# Patient Record
Sex: Female | Born: 1985 | Race: White | Hispanic: No | Marital: Single | Smoking: Current every day smoker
Health system: Southern US, Community
[De-identification: ages and names within clinical notes are randomized; demographics above are authoritative.]

## PROBLEM LIST (undated history)

## (undated) HISTORY — PX: CHOLECYSTECTOMY: SHX55

---

## 2007-01-20 ENCOUNTER — Ambulatory Visit: Payer: Self-pay | Admitting: Cardiology

## 2013-04-22 ENCOUNTER — Emergency Department (HOSPITAL_COMMUNITY): Payer: Medicare Other

## 2013-04-22 ENCOUNTER — Emergency Department (HOSPITAL_COMMUNITY)
Admission: EM | Admit: 2013-04-22 | Discharge: 2013-04-22 | Discharge status: Against Medical Advice | Payer: Medicare Other | Attending: Emergency Medicine | Admitting: Emergency Medicine

## 2013-04-22 ENCOUNTER — Encounter (HOSPITAL_COMMUNITY): Payer: Self-pay | Admitting: Emergency Medicine

## 2013-04-22 DIAGNOSIS — M25569 Pain in unspecified knee: Secondary | ICD-10-CM | POA: Insufficient documentation

## 2013-04-22 DIAGNOSIS — Z87828 Personal history of other (healed) physical injury and trauma: Secondary | ICD-10-CM | POA: Insufficient documentation

## 2013-04-22 DIAGNOSIS — F17200 Nicotine dependence, unspecified, uncomplicated: Secondary | ICD-10-CM | POA: Insufficient documentation

## 2013-04-22 NOTE — ED Notes (Signed)
C/O left knee pain that started 2 days ago. Pt states she sprained meniscus 5119yrs ago on same knee. Pt states she has tried elevating it, ice, elevation, and ibuprofen and Tylenol without relief.

## 2013-04-22 NOTE — ED Notes (Signed)
Pt c/o knee pain x 2 days, denies injury.

## 2015-06-14 IMAGING — CR DG KNEE COMPLETE 4+V*L*
4 series · 4 of 4 positions shown · non-contrast
Comparison: None.

CLINICAL DATA: Pain of 2-3 days duration

EXAM:
LEFT KNEE - COMPLETE 4+ VIEW

[view not recorded (1 of 4)]
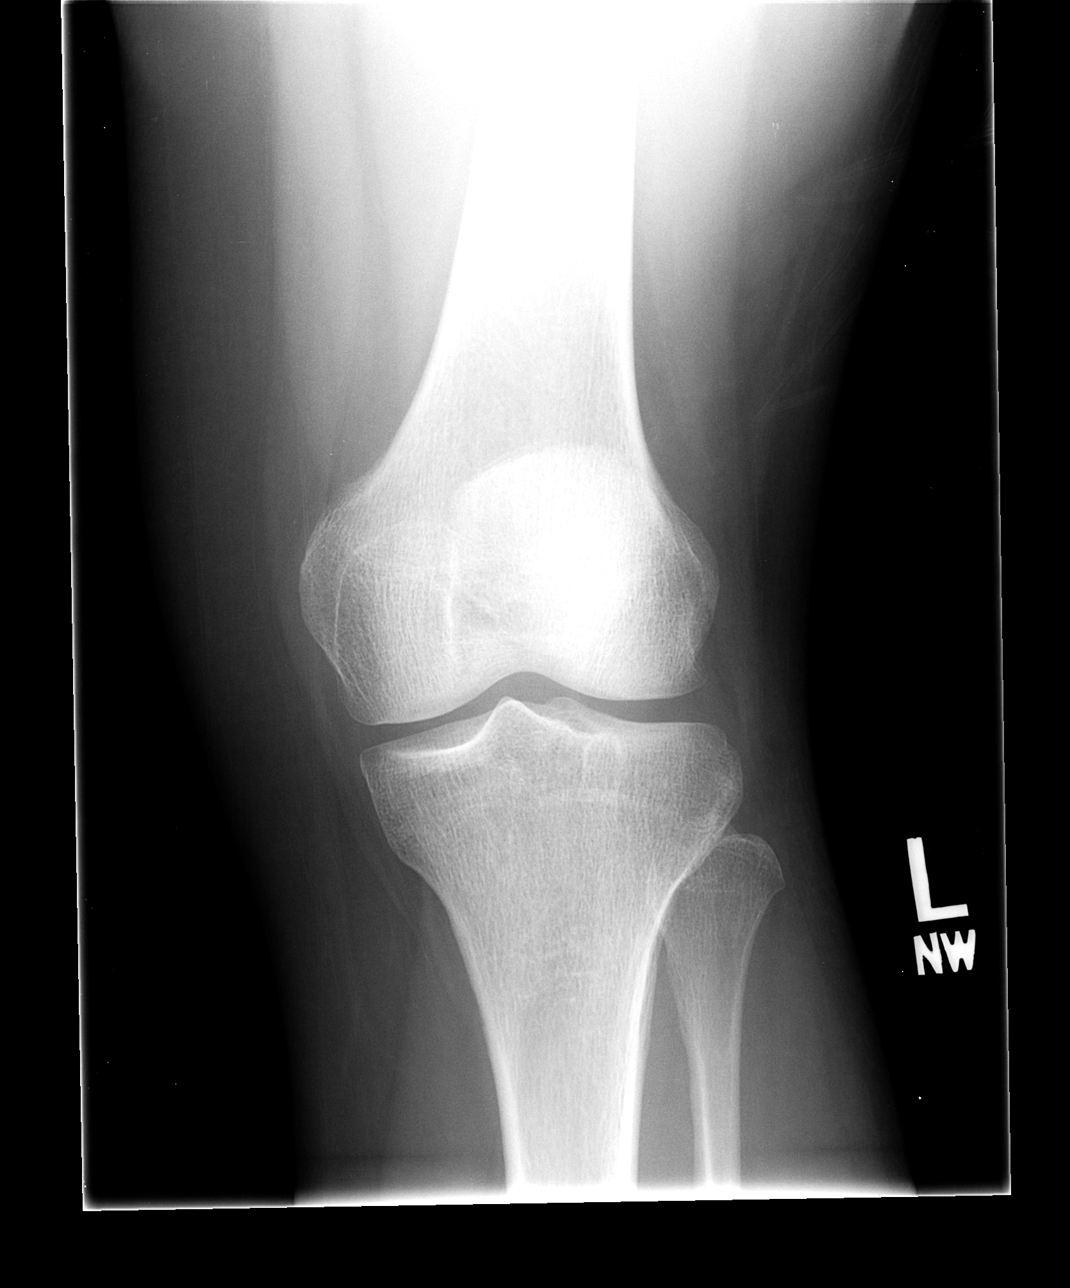

[view not recorded (2 of 4)]
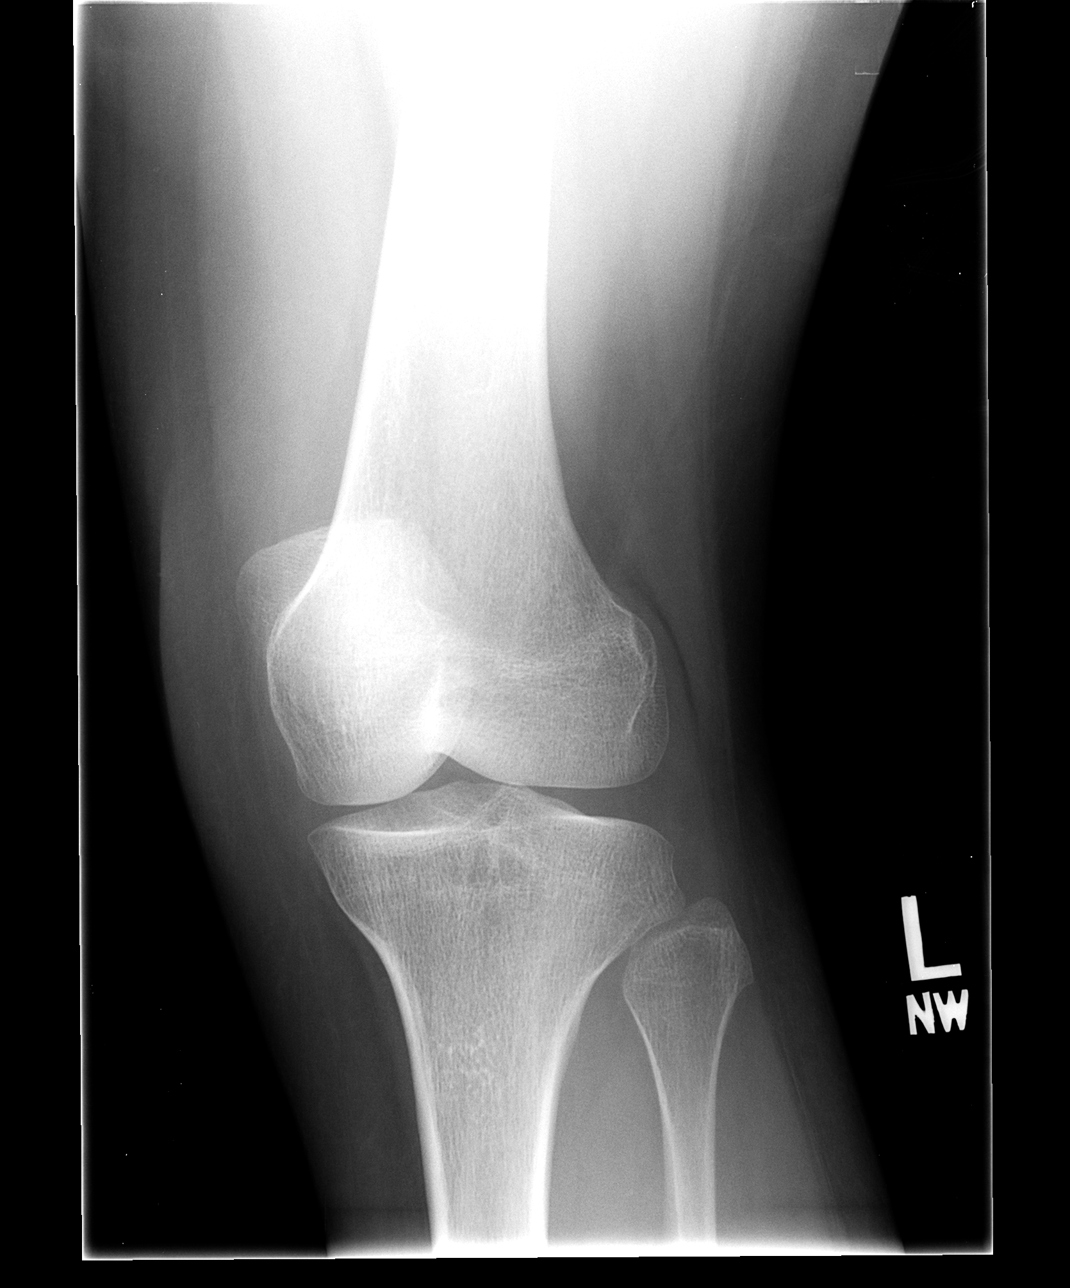

[view not recorded (3 of 4)]
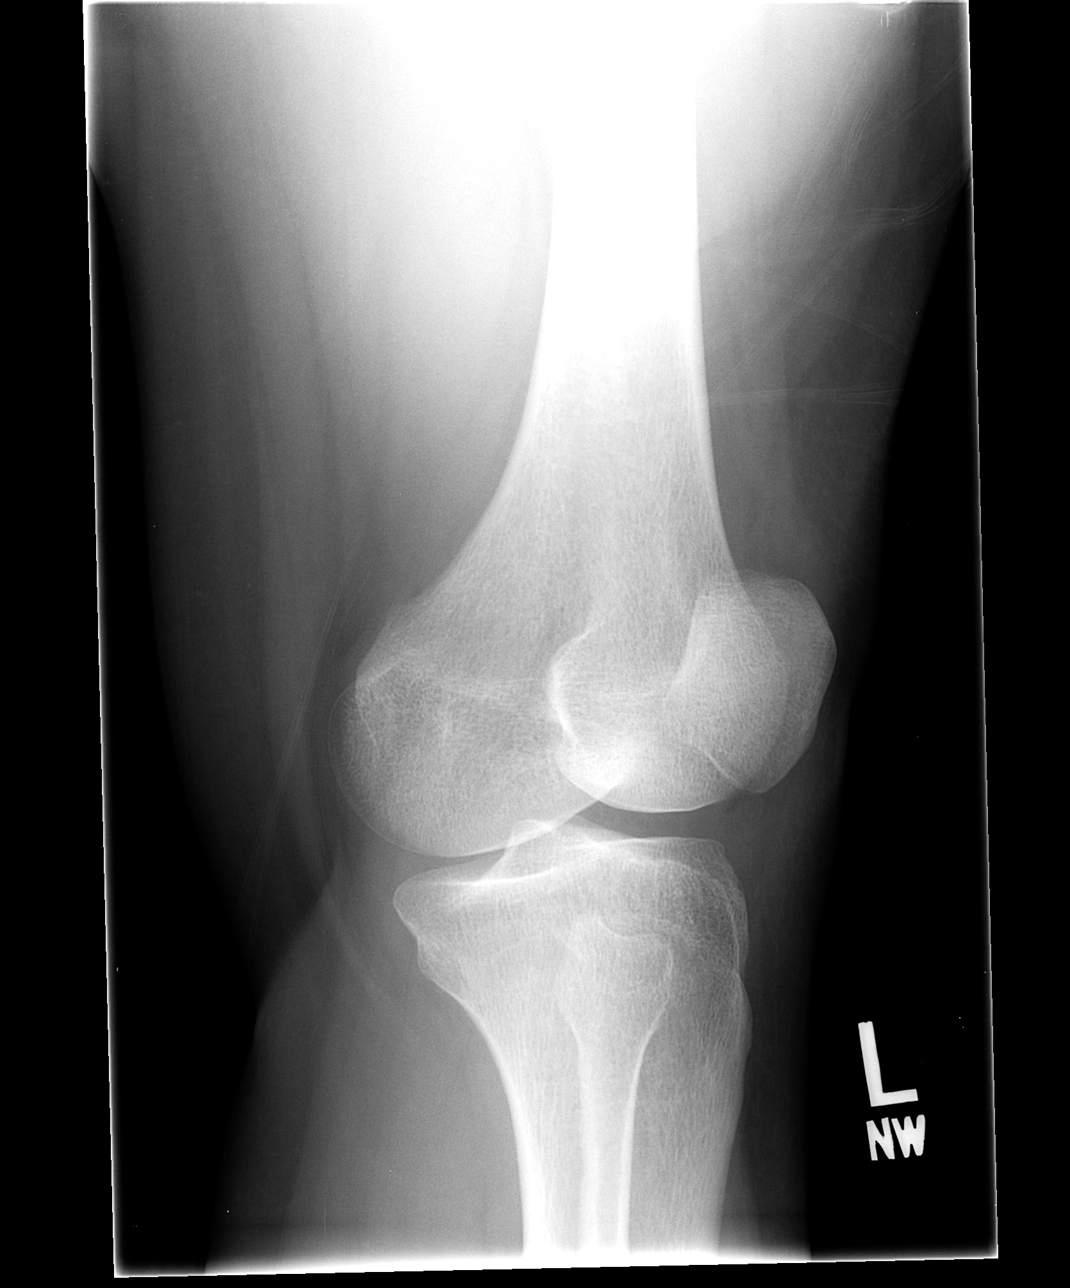

[view not recorded (4 of 4)]
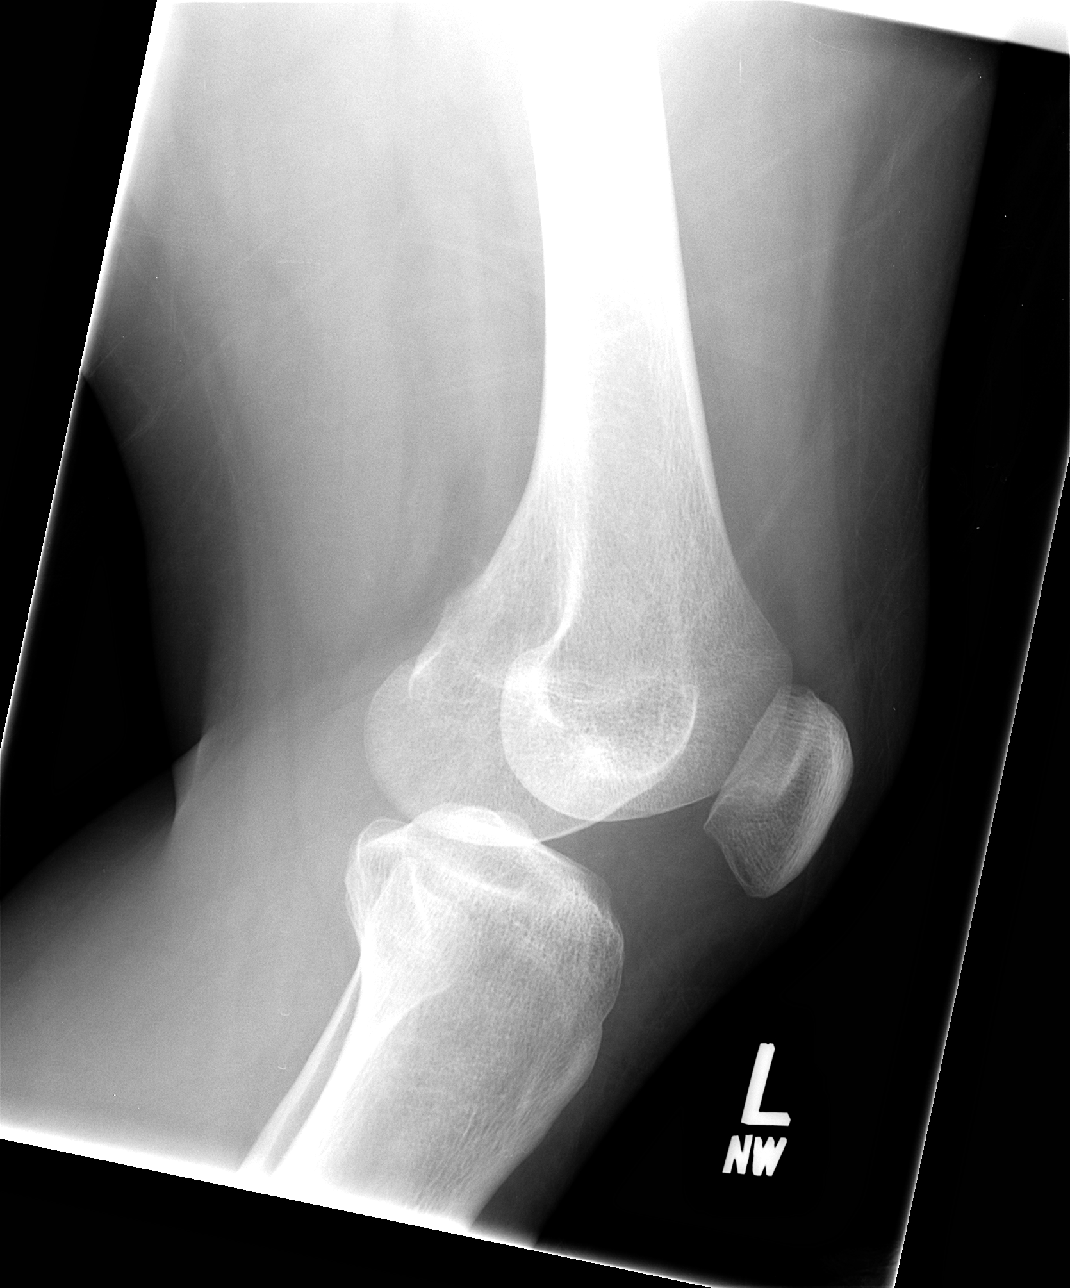

[4 of 4 positions shown; findings below may reference images not displayed]

FINDINGS: Lateral view is off axis. No visible joint effusion. No degenerative
change or traumatic finding. No focal lesion.
IMPRESSION: Negative radiographs. Lateral view is off axis, but there is no sign
of an effusion.
